# Patient Record
Sex: Male | Born: 1955 | Race: White | Hispanic: No | Marital: Married | Smoking: Former smoker
Health system: Southern US, Community
[De-identification: ages and names within clinical notes are randomized; demographics above are authoritative.]

---

## 2000-08-16 ENCOUNTER — Ambulatory Visit (HOSPITAL_COMMUNITY): Admission: RE | Admit: 2000-08-16 | Discharge: 2000-08-16 | Payer: Self-pay

## 2002-11-10 ENCOUNTER — Encounter: Admission: RE | Admit: 2002-11-10 | Discharge: 2002-11-10 | Payer: Self-pay | Admitting: Family Medicine

## 2005-07-24 ENCOUNTER — Ambulatory Visit: Payer: Self-pay | Admitting: Family Medicine

## 2005-08-21 ENCOUNTER — Ambulatory Visit (HOSPITAL_COMMUNITY): Admission: RE | Admit: 2005-08-21 | Discharge: 2005-08-22 | Payer: Self-pay | Admitting: General Surgery

## 2005-08-21 ENCOUNTER — Encounter (INDEPENDENT_AMBULATORY_CARE_PROVIDER_SITE_OTHER): Payer: Self-pay | Admitting: Specialist

## 2005-09-21 ENCOUNTER — Ambulatory Visit: Payer: Self-pay | Admitting: Internal Medicine

## 2006-03-05 ENCOUNTER — Emergency Department (HOSPITAL_COMMUNITY): Admission: EM | Admit: 2006-03-05 | Discharge: 2006-03-05 | Payer: Self-pay | Admitting: Emergency Medicine

## 2006-03-06 ENCOUNTER — Encounter: Payer: Self-pay | Admitting: Internal Medicine

## 2006-10-25 ENCOUNTER — Emergency Department (HOSPITAL_COMMUNITY): Admission: EM | Admit: 2006-10-25 | Discharge: 2006-10-26 | Payer: Self-pay | Admitting: Emergency Medicine

## 2006-10-29 ENCOUNTER — Ambulatory Visit: Payer: Self-pay | Admitting: Family Medicine

## 2006-10-29 DIAGNOSIS — R42 Dizziness and giddiness: Secondary | ICD-10-CM | POA: Insufficient documentation

## 2007-10-10 ENCOUNTER — Encounter: Admission: RE | Admit: 2007-10-10 | Discharge: 2007-10-10 | Payer: Self-pay | Admitting: Sports Medicine

## 2007-10-31 ENCOUNTER — Encounter: Admission: RE | Admit: 2007-10-31 | Discharge: 2007-10-31 | Payer: Self-pay | Admitting: Sports Medicine

## 2010-05-23 NOTE — Op Note (Signed)
NAME:  Zachary Wagner, Zachary Wagner NO.:  [id number]   MEDICAL RECORD NO.:  [id number]          PATIENT TYPE:  AMB   LOCATION:  DAY                          FACILITY:  [facility]   PHYSICIAN:  Adolph Pollack, M.D.DATE OF BIRTH:  1955/12/01   DATE OF PROCEDURE:  08/21/2005  DATE OF DISCHARGE:                                 OPERATIVE REPORT   PREOPERATIVE DIAGNOSES:  1. Right buttock abscess.  2. Right fifth finger abscess.   POSTOPERATIVE DIAGNOSIS:  1. Right buttock necrotizing soft tissue infection.  2. Right finger abscess.   PROCEDURE:  1. Incision, drainage and debridement of right buttock soft necrotizing      soft tissue infection.  2. Incision and drainage of right fifth finger abscess.   SURGEON:  Adolph Pollack, M.D.   ANESTHESIA:  General.   INDICATIONS:  This is a 55 year old male who had a perirectal abscess  drained in the office by Dr. Colin Benton.  For about a week, he has had a swelling  and redness of area in the right buttock region and also on the right fifth  finger.  He has been squeezing some pus out of the right fifth finger.  He  presented to Dr. Tami Lin office today, and she has a partial incision and  drainage of the right buttock area.  However, it was too extensive and  needed operative debridement, and so he has been sent here to have that done  at [facility] under general anesthesia.  I have examined him and discussed  the procedure and risks including but limited to bleeding, infection and  potential need to go back to the operating room.   TECHNIQUE:  He is brought to the operating room, placed supine on the  operating room, and given general anesthetic.  He was then turned slightly  about 45 degrees angle exposing the area on the right buttock.  The right  buttock area was sterilely prepped and draped.  There was an open wound  draining purulent material, and this had already been cultured by Dr. Colin Benton  today.  Using electrocautery, I  made a large elliptical incision around the  open wound to include all the indurated tissue.  The wound tracked  approximately 5 cm deep, and I debrided some necrotic tissue using  electrocautery.  Some of this went down to muscle.  Minimal muscle necrosis  was noted.  I debrided it down to good bleeding tissue.  Once I had done  this, I injected some Marcaine around the area for local anesthetic effect.  I then packed it with saline-soaked Kerlix followed by a bulky dressing.   Following this, gowns, gloves and instruments were changed, and he was  placed supine, and the right hand was sterilely prepped and draped.  I made  a cruciate incision over the right fifth finger on the dorsal of it and  drained a fair amount of purulent material.  It was tracking somewhat  laterally.  I opened the wound up slightly more and then drained more  purulent material and irrigated it copiously.  I packed with quarter inch  Iodoform gauze followed by dry dressing.   He tolerated both procedures well and without apparent complications.  A  culture was sent from the right finger incision and drainage.  He was taken  to recovery in satisfactory condition.      Adolph Pollack, M.D.  Electronically Signed     TJR/MEDQ  D:  08/21/2005  T:  08/22/2005  Job:  [id number]

## 2010-05-23 NOTE — Procedures (Signed)
[facility]  Patient:    Zachary Wagner, Zachary Wagner                    MRN: [id number] Proc. Date: 08/16/00 Adm. Date:  [id number] Attending:  Louie Bun CC:         Sibyl Parr. Darrick Penna, M.D.   Procedure Report  PROCEDURE:  Colonoscopy.  INDICATION FOR PROCEDURE:  Family history of colon cancer in a first degree relative with negative colonoscopy five years ago.  DESCRIPTION OF PROCEDURE:  The patient was placed in the left lateral decubitus position and placed on the pulse monitor with continuous low flow oxygen delivered by nasal cannula. He was sedated with 80 mg IV Demerol and  8 mg IV Versed. The Olympus video colonoscope was inserted into the rectum and advanced to the cecum, confirmed by transillumination at McBurneys point and visualization of the ileocecal valve and appendiceal orifice. The prep was excellent.  The cecum, ascending, transverse, descending and sigmoid colon all appeared normal with no masses, polyps, diverticula or other mucosal abnormalities. The rectum likewise appeared normal and retroflexed view of the anus did reveal some small internal hemorrhoids. The colonoscope was then withdrawn and the patient returned to the recovery room in stable condition. The patient tolerated the procedure well and there were no immediate complications.  IMPRESSION:  Internal hemorrhoids otherwise normal colonoscopy.  PLAN:  Repeat colonoscopy in five years. DD:  08/16/00 TD:  08/16/00 Job: [id number]

## 2011-04-02 ENCOUNTER — Ambulatory Visit: Payer: Self-pay | Admitting: Family Medicine

## 2012-02-08 ENCOUNTER — Ambulatory Visit (INDEPENDENT_AMBULATORY_CARE_PROVIDER_SITE_OTHER): Payer: BC Managed Care – PPO | Admitting: Surgery

## 2012-02-08 ENCOUNTER — Encounter (INDEPENDENT_AMBULATORY_CARE_PROVIDER_SITE_OTHER): Payer: Self-pay | Admitting: Surgery

## 2012-02-08 VITALS — BP 124/80 | HR 100 | Temp 98.0°F | Resp 20 | Ht 70.0 in | Wt 169.0 lb

## 2012-02-08 DIAGNOSIS — L02215 Cutaneous abscess of perineum: Secondary | ICD-10-CM

## 2012-02-08 DIAGNOSIS — L02219 Cutaneous abscess of trunk, unspecified: Secondary | ICD-10-CM

## 2012-02-08 NOTE — Progress Notes (Signed)
General Surgery [facility]  Chief Complaint  Patient presents with  . New Evaluation    evaluate abscess groin - referral from Mariann Laster, NP and Dr. 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 Patient developed a area of induration and tenderness in the groin. This enlarged and became more symptomatic. He was seen at the walk-in clinic on 02/06/2012. An attempt was made at incision and drainage without success. Patient was started on Bactrim and Flagyl. He was referred to surgery for further evaluation and management.  Patient is using antibacterial soap. Pain has persisted. He presents today for incision and drainage.  No past medical history on file.   Current Outpatient Prescriptions  Medication Sig Dispense Refill  . aspirin 81 MG tablet Take 81 mg by mouth daily.      . metroNIDAZOLE (FLAGYL) 500 MG tablet Take 500 mg by mouth 3 (three) times daily.      Marland Kitchen sulfamethoxazole-trimethoprim (BACTRIM DS) 800-160 MG per tablet Take 1 tablet by mouth 2 (two) times daily.      . methimazole (TAPAZOLE) 5 MG tablet Take 5 mg by mouth 3 (three) times daily.         No Known Allergies   No family history on file.   History   Social History  . Marital Status: Married    Spouse Name: N/A    Number of Children: N/A  . Years of Education: N/A   Social History Main Topics  . Smoking status: Former Games developer  . Smokeless tobacco: None     Comment: quit 2012  . Alcohol Use: None  . Drug Use: None  . Sexually Active: None   Other Topics Concern  . None   Social History Narrative  . None     REVIEW OF SYSTEMS - PERTINENT POSITIVES ONLY: Pain on perineum with fluctuance. No drainage.  EXAM: Filed Vitals:   02/08/12 1522  BP: 124/80  Pulse: 100  Temp: 98 F (36.7 C)  Resp: 20    HEENT: normocephalic; pupils equal and reactive; sclerae clear; dentition  good; mucous membranes moist NECK:  symmetric on extension; no palpable anterior or posterior cervical lymphadenopathy; no supraclavicular masses; no tenderness CHEST: clear to auscultation bilaterally without rales, rhonchi, or wheezes CARDIAC: regular rate and rhythm without significant murmur; peripheral pulses are full GU:  Just to the left of midline between the scrotum and the anus is an area of induration and fluctuance measuring approximately 3 cm in diameter; moderate tenderness; no drainage EXT:  non-tender without edema; no deformity NEURO: no gross focal deficits; no sign of tremor  PROCEDURE: Under aseptic conditions using local anesthetic a 1 cm incision is made in the roof of the abscess cavity; a large amount of brown foul-smelling material is evacuated. Cavity is packed with quarter inch iodoform gauze packing and a dry gauze dressing is placed. Cultures are obtained for the laboratory.   LABORATORY RESULTS: See [facility] ([facility]Epic) for most recent results   RADIOLOGY RESULTS: See [facility] (CHL-Epic) for most recent results   IMPRESSION: Perineal abscess  PLAN: Usual wound care instructions were provided to the patient. He will remove the packing in 24-48 hours. He will continue on both Flagyl and Bactrim until culture results are available for review.  Patient will return for surgical follow-up as necessary.  Velora Heckler, MD, FACS General & Endocrine Surgery [facility]   Visit Diagnoses: 1. Abscess, perineum     Primary Care Physician: Mliss Sax, MD

## 2012-02-08 NOTE — Patient Instructions (Signed)
May begin tub soaks in AM 2/4.  Remove packing in 24 - 48 hours.  Wear pad or gauze until no further drainage.  Continue current antibiotics until gone.  Velora Heckler, MD, [facility] Office: [phone]

## 2012-02-11 ENCOUNTER — Telehealth (INDEPENDENT_AMBULATORY_CARE_PROVIDER_SITE_OTHER): Payer: Self-pay | Admitting: General Surgery

## 2012-02-11 LAB — WOUND CULTURE: Gram Stain: NONE SEEN

## 2012-02-11 NOTE — Telephone Encounter (Signed)
Patient returned call based on message left earlier today. He was advised of wound culture results and to continue the antibiotics until finished.

## 2012-02-11 NOTE — Telephone Encounter (Signed)
Called and left message for patient to return call. Per Dr. Gerrit Friends: "Let patient know there is no MRSA on cultures"  Advised for patient to ask for me and if unavailable for someone in triage (in order to be given the information above). Per last office visit notation from Dr. Gerrit Friends this patient was to complete antibiotics and return prn.

## 2012-04-06 ENCOUNTER — Telehealth (INDEPENDENT_AMBULATORY_CARE_PROVIDER_SITE_OTHER): Payer: Self-pay | Admitting: *Deleted

## 2012-04-06 NOTE — Telephone Encounter (Signed)
Reviewed request with Dr Gerrit Friends. Pt advised he can come to urgent office today, see his PCP or be seen tomorrow in urgent office. Dr Gerrit Friends will not call in antibiotic unless pt is seen. Pt states he understands and will call back if he wants to be seen tomorrow.

## 2012-04-06 NOTE — Telephone Encounter (Signed)
Patient called to state that he believes he has a new abscess coming up.  Patient will call back tomorrow to try to schedule in urgent office since he can't come today.  Patient did ask if I could pass a message onto Gerkin MD to see if by any chance he would just call in an antibiotic.  I explained to patient that he would really need to be seen and antibiotics can't just be called in without assessing the area.  Patient stated he understood but still asked me to ask Gerkin MD.

## 2013-09-28 ENCOUNTER — Other Ambulatory Visit (INDEPENDENT_AMBULATORY_CARE_PROVIDER_SITE_OTHER): Payer: Self-pay

## 2013-09-28 DIAGNOSIS — L02214 Cutaneous abscess of groin: Secondary | ICD-10-CM

## 2013-10-02 LAB — WOUND CULTURE: Gram Stain: NONE SEEN

## 2015-06-11 ENCOUNTER — Other Ambulatory Visit: Payer: Self-pay | Admitting: Family Medicine

## 2015-06-11 DIAGNOSIS — R9389 Abnormal findings on diagnostic imaging of other specified body structures: Secondary | ICD-10-CM

## 2015-06-13 ENCOUNTER — Other Ambulatory Visit: Payer: Self-pay

## 2015-06-14 ENCOUNTER — Other Ambulatory Visit: Payer: Self-pay

## 2015-06-19 ENCOUNTER — Ambulatory Visit
Admission: RE | Admit: 2015-06-19 | Discharge: 2015-06-19 | Disposition: A | Discharge status: Home / Self Care | Payer: BC Managed Care – PPO | Source: Ambulatory Visit | Attending: Family Medicine | Admitting: Family Medicine

## 2015-06-19 DIAGNOSIS — R9389 Abnormal findings on diagnostic imaging of other specified body structures: Secondary | ICD-10-CM

## 2015-06-27 ENCOUNTER — Other Ambulatory Visit: Payer: Self-pay | Admitting: Gastroenterology

## 2015-07-31 ENCOUNTER — Ambulatory Visit (INDEPENDENT_AMBULATORY_CARE_PROVIDER_SITE_OTHER): Payer: BC Managed Care – PPO | Admitting: Podiatry

## 2015-07-31 ENCOUNTER — Encounter: Payer: Self-pay | Admitting: Podiatry

## 2015-07-31 VITALS — BP 94/61 | HR 77 | Resp 14

## 2015-07-31 DIAGNOSIS — L600 Ingrowing nail: Secondary | ICD-10-CM | POA: Diagnosis not present

## 2015-07-31 NOTE — Progress Notes (Signed)
   Subjective:    Patient ID: Zachary Wagner, male    DOB: 02/19/1955, 60 y.o.   MRN: [id number]  HPI this patient presents the office saying that his big toe, right foot has been painful due to an ingrowing toenail. He says this is been going on for 1-1/2 months. He states he has self treated with Epson salts. He states this past Monday,. it was painful as he was [place] the golf [place], but today there is no pain in his right big toe.  He presents to the office to discuss this condition    Review of Systems  All other systems reviewed and are negative.      Objective:   Physical Exam GENERAL APPEARANCE: Alert, conversant. Appropriately groomed. No acute distress.  VASCULAR: Pedal pulses are  palpable at  [facility] and PT bilateral.  Capillary refill time is immediate to all digits,  Normal temperature gradient.  Digital hair growth is present bilateral  NEUROLOGIC: sensation is normal to 5.07 monofilament at 5/5 sites bilateral.  Light touch is intact bilateral, Muscle strength normal.  MUSCULOSKELETAL: acceptable muscle strength, tone and stability bilateral.  Intrinsic muscluature intact bilateral.  Rectus appearance of foot and digits noted bilateral.   DERMATOLOGIC: skin color, texture, and turgor are within normal limits.  No preulcerative lesions or ulcers  are seen, no interdigital maceration noted.  No open lesions present.  . No drainage noted.  NAILS  Marked incurvation lateral border right great toe.  Redness and swelling along the llateral border right hallux.         Assessment & Plan:  Ingrown toenail right hallux  IE  Discussed this condition with the patient, since there is no pain at this visit. I told him to allow the toenail to declare itself. I recommended he soak in Epsom salts and if the nail improves there will be no need for follow-up if the problem worsens, I described surgical correction for this condition. Return to clinic when necessary   Helane Gunther DPM

## 2016-04-29 ENCOUNTER — Ambulatory Visit: Payer: BC Managed Care – PPO | Admitting: Podiatry

## 2016-06-02 ENCOUNTER — Encounter: Payer: Self-pay | Admitting: Podiatry

## 2016-06-02 ENCOUNTER — Ambulatory Visit (INDEPENDENT_AMBULATORY_CARE_PROVIDER_SITE_OTHER): Payer: BC Managed Care – PPO | Admitting: Podiatry

## 2016-06-02 DIAGNOSIS — L600 Ingrowing nail: Secondary | ICD-10-CM

## 2016-06-02 NOTE — Patient Instructions (Signed)
ANTIBACTERIAL SOAP INSTRUCTIONS  THE DAY AFTER PROCEDURE  Please follow the instructions your doctor has marked.   Shower as usual. Before getting out, place a drop of antibacterial liquid soap (Dial) on a wet, clean washcloth.  Gently wipe washcloth over affected area.  Afterward, rinse the area with warm water.  Blot the area dry with a soft cloth and cover with antibiotic ointment (neosporin, polysporin, bacitracin) and band aid or gauze and tape  Place 3-4 drops of antibacterial liquid soap in a quart of warm tap water.  Submerge foot into water for 2 minutes.  If bandage was applied after your procedure, leave on to allow for easy lift off, then remove and continue with soak for the remaining time.  Next, blot area dry with a soft cloth and cover with a bandage.  Apply other medications as directed by your doctor, such as cortisporin otic solution (eardrops) or neosporin antibiotic ointment  Soaking it is optional

## 2016-06-02 NOTE — Progress Notes (Signed)
Patient ID: Wallace KellerJames R Fike, male   DOB: 12/15/1955, 61 y.o.   MRN: [id number]   Subjective: This patient presents today complaining of increasing pain for the past 3 months on the lateral margin right hallux toenail. Patient applies topical medication to the area which provides occasional temporary relief. Describes an episode of drainage with some relief of pain  Patient is former smoker discontinue proximally 4 years ago with a 30 year history of smoking  Objective: Orientated 3 DP and PT pulses 2/4 bilaterally Capillary reflex immediate bilaterally Sensation to 10 g monofilament wire intact 5/5 bilaterally Vibratory sensation reactive bilaterally Ankle reflexes reactive bilaterally Lateral margin the right hallux toenails incurvated with low-grade erythema, edema and palpable tenderness Texture and turgor within normal limits Manual motor testing dorsi flexion, plantar flexion 5/5 bilaterally  Assessment: Satisfactory neurovascular status Ingrowing lateral margin right hallux toenail  Plan: Discussed treatment options including repetitive debridement versus permanent nail surgery. Patient opts for permanent nail surgery and verbally consents to procedure The right hallux was blocked with 3 mL 50-50 mixture of 2% plain Xylocaine and 0.5% plain Sensorcaine. The hallux is pain with Betadine and exsanguinated. The Lateral margin the right hallux toenail was excised and a phenol matricectomy performed. The Surgical site was flushed with alcohol and antibiotic compression dressing was applied. The tourniquet was released and spontaneous capillary fill time noted in the right hallux. Patient tolerated procedure without any difficulty. Postoperative oral reconstruction provided Reappoint at patient's request

## 2018-03-01 IMAGING — CT CT CHEST W/O CM
3 of 4 series · 17 of 30 positions shown, 19 images · non-contrast
Comparison: 04/05/2007

CLINICAL DATA: Followup abnormal x-ray

EXAM:
CT CHEST WITHOUT CONTRAST
TECHNIQUE: Multidetector CT imaging of the chest was performed following the
standard protocol without IV contrast.

[Series 3: chest w/o · axial · non-contrast · 0.64mm/px · z∈[-308,-41]mm · 8 of 139 slices shown, 10 images]
[im 16/139  mediastinal]
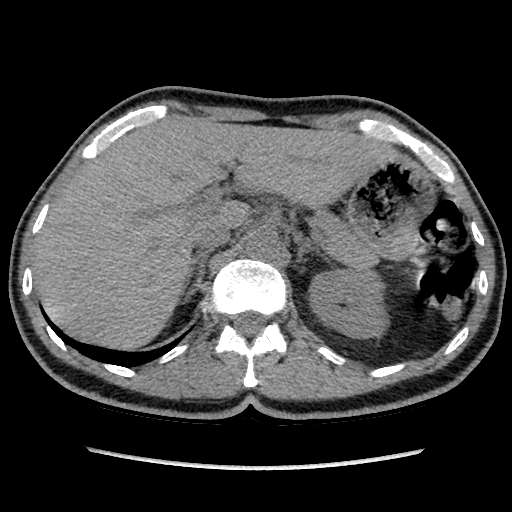
[im 16/139  lung]
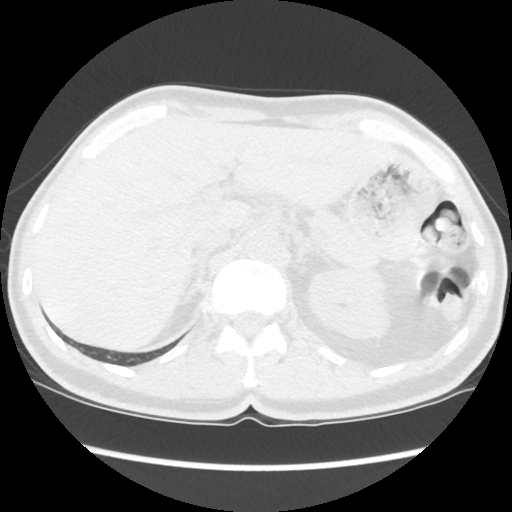
[im 31/139  lung]
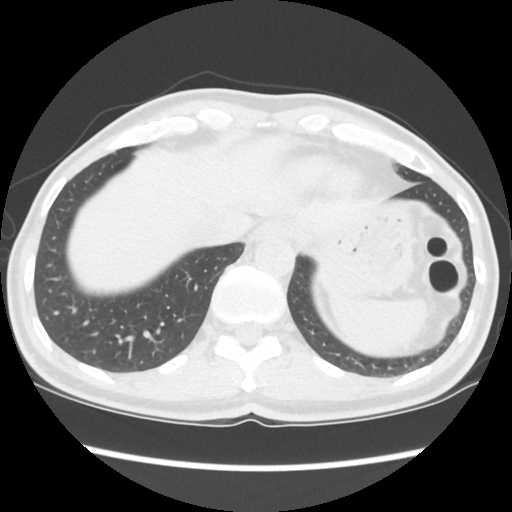
[im 47/139  lung]
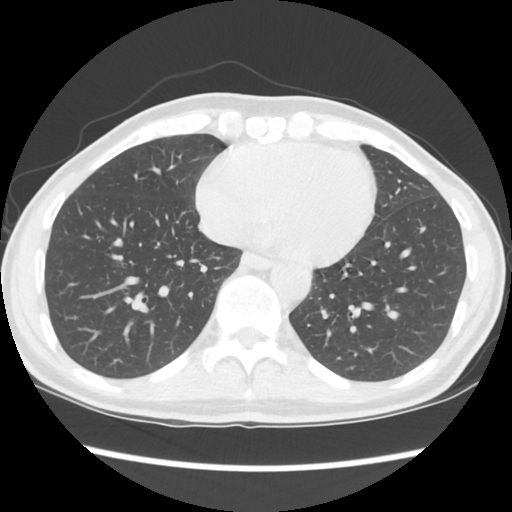
[im 62/139  lung]
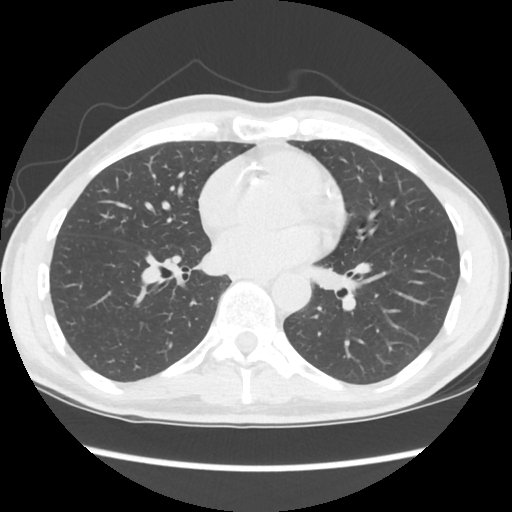
[im 77/139  mediastinal]
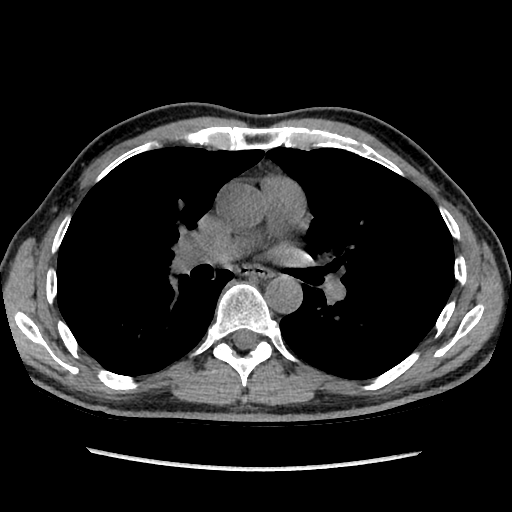
[im 77/139  lung]
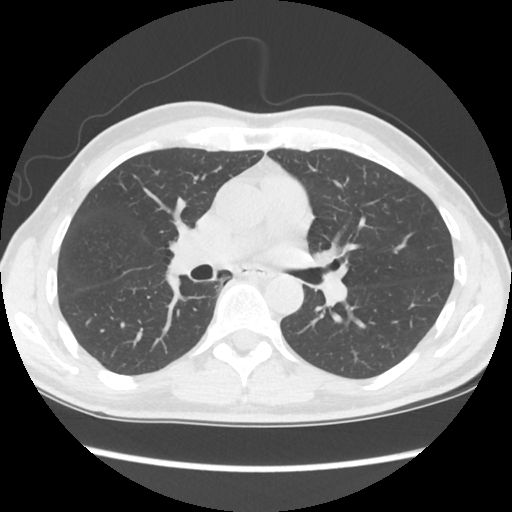
[im 93/139  lung]
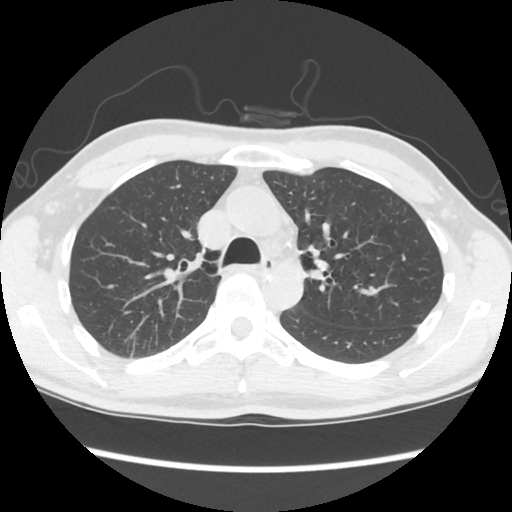
[im 108/139  lung]
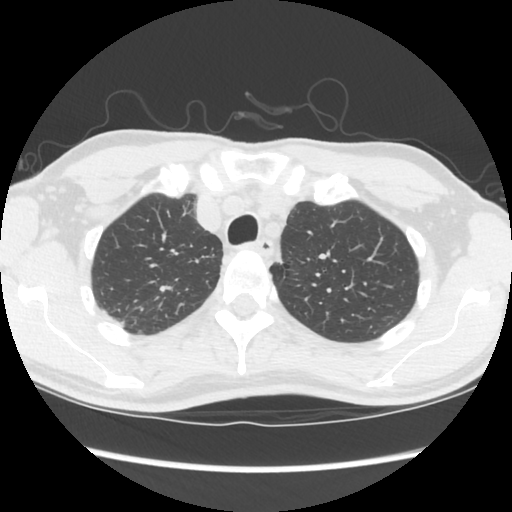
[im 123/139  lung]
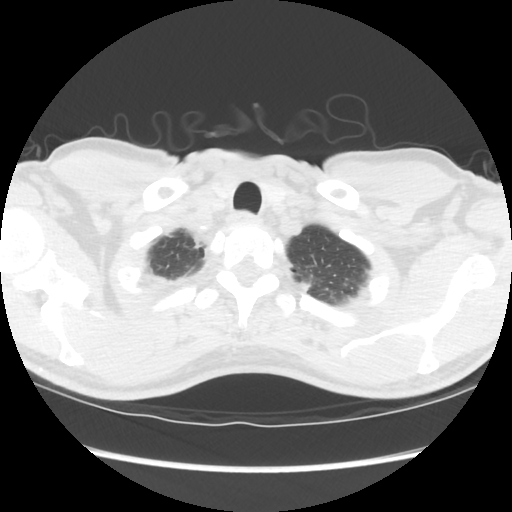

[Series 4: lung windows · axial · 0.64mm/px · z∈[-304,-46]mm · 7 of 139 slices shown]
[im 18/139  lung]
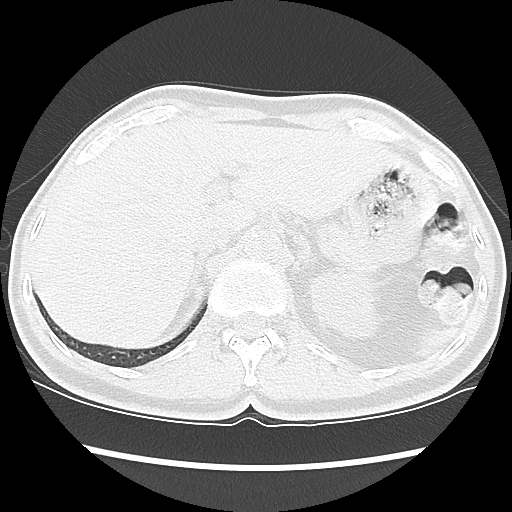
[im 35/139  lung]
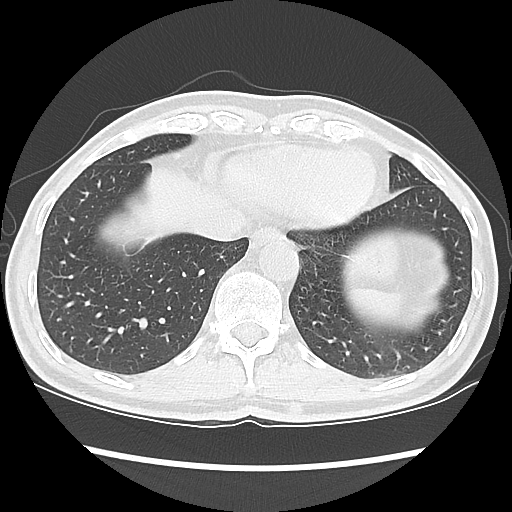
[im 52/139  lung]
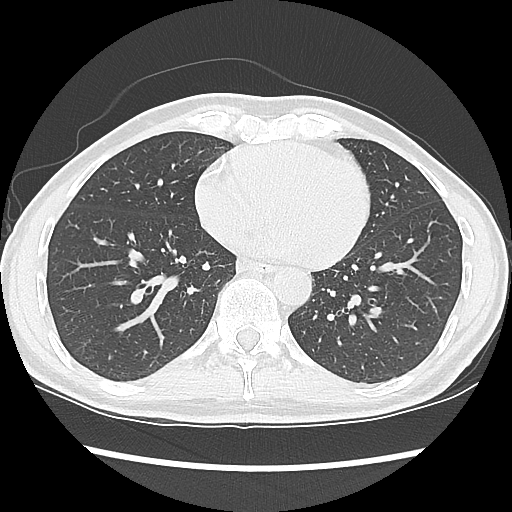
[im 70/139  lung]
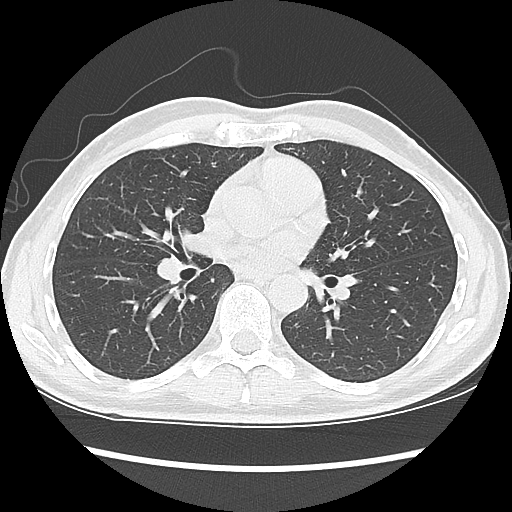
[im 87/139  lung]
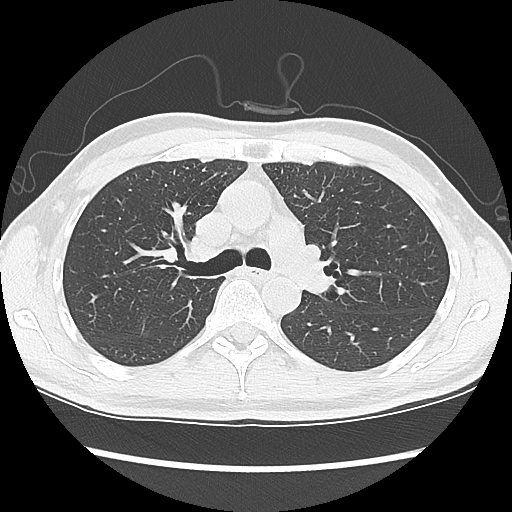
[im 104/139  lung]
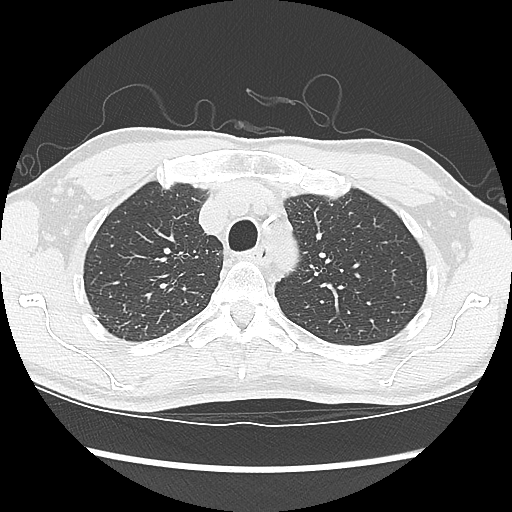
[im 121/139  lung]
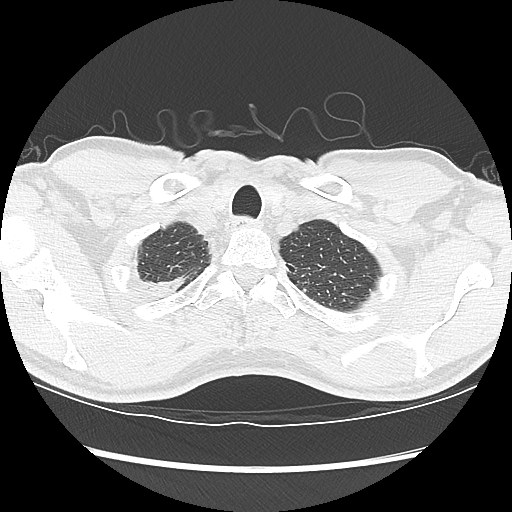

[Series 602: sagittal body · sagittal · 0.68mm/px · 2 of 133 slices shown]
[im 17/133  mediastinal]
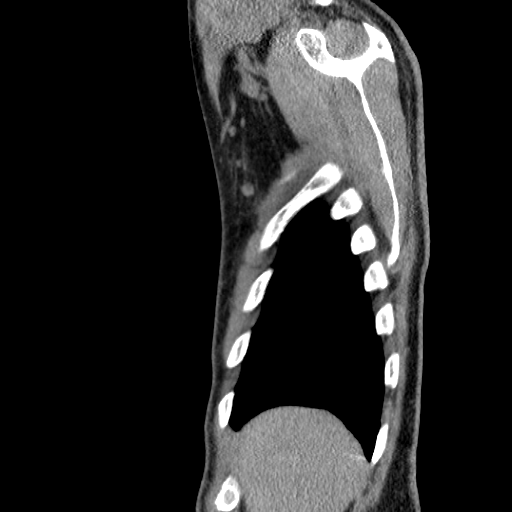
[im 34/133  mediastinal]
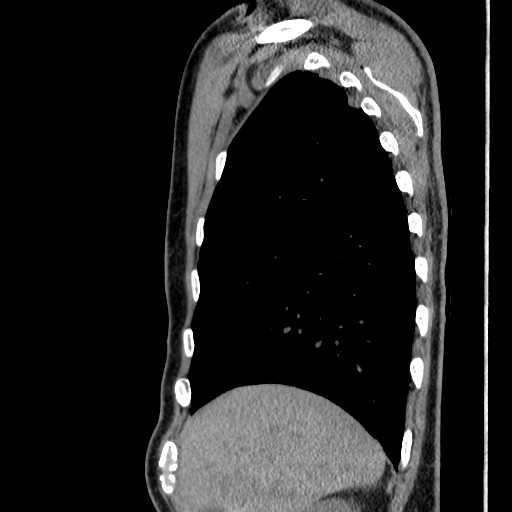

[17 of 30 positions shown; findings below may reference images not displayed]

FINDINGS: Mediastinum/Lymph Nodes: Normal heart size. There is no pericardial
effusion. The trachea appears patent and is midline. Unremarkable
appearance of the esophagus. Aortic atherosclerosis noted.
Calcification within the RCA, LAD and left circumflex coronary
artery identified. No mediastinal or hilar adenopathy. No axillary
or supraclavicular adenopathy.

Lungs/Pleura: No pleural fluid identified. Biapical scar like
densities are identified. Mild changes of centrilobular emphysema
noted. No airspace consolidation identified. No suspicious pulmonary
parenchymal nodule or mass identified.

Upper abdomen: The visualized portions of the liver appear normal.
The visualized portions of the adrenal glands, spleen and kidneys
and pancreas are also unremarkable.

Musculoskeletal: No chest wall mass or suspicious bone lesions
identified.
IMPRESSION: 1. No active cardiopulmonary abnormalities.
2. Aortic atherosclerosis and 3 vessel coronary artery
calcification.
3. Emphysema and biapical scar like densities noted.
4. No suspicious pulmonary nodules identified.
